# Patient Record
Sex: Female | Born: 1970 | Race: White | Hispanic: No | Marital: Single | Smoking: Current some day smoker
Health system: Southern US, Community
[De-identification: ages and names within clinical notes are randomized; demographics above are authoritative.]

## PROBLEM LIST (undated history)

## (undated) DIAGNOSIS — I10 Essential (primary) hypertension: Secondary | ICD-10-CM

## (undated) DIAGNOSIS — I4891 Unspecified atrial fibrillation: Secondary | ICD-10-CM

## (undated) HISTORY — PX: BREAST ENHANCEMENT SURGERY: SHX7

---

## 2015-05-26 ENCOUNTER — Emergency Department (HOSPITAL_COMMUNITY)
Admission: EM | Admit: 2015-05-26 | Discharge: 2015-05-26 | Disposition: A | Discharge status: Home / Self Care | Payer: Medicaid Other | Attending: Emergency Medicine | Admitting: Emergency Medicine

## 2015-05-26 ENCOUNTER — Emergency Department (HOSPITAL_COMMUNITY): Payer: Medicaid Other

## 2015-05-26 ENCOUNTER — Encounter (HOSPITAL_COMMUNITY): Payer: Self-pay | Admitting: *Deleted

## 2015-05-26 DIAGNOSIS — S79912A Unspecified injury of left hip, initial encounter: Secondary | ICD-10-CM | POA: Diagnosis not present

## 2015-05-26 DIAGNOSIS — S20219A Contusion of unspecified front wall of thorax, initial encounter: Secondary | ICD-10-CM | POA: Diagnosis not present

## 2015-05-26 DIAGNOSIS — Z3202 Encounter for pregnancy test, result negative: Secondary | ICD-10-CM | POA: Diagnosis not present

## 2015-05-26 DIAGNOSIS — S59902A Unspecified injury of left elbow, initial encounter: Secondary | ICD-10-CM | POA: Insufficient documentation

## 2015-05-26 DIAGNOSIS — Y9389 Activity, other specified: Secondary | ICD-10-CM | POA: Insufficient documentation

## 2015-05-26 DIAGNOSIS — S29001A Unspecified injury of muscle and tendon of front wall of thorax, initial encounter: Secondary | ICD-10-CM | POA: Diagnosis present

## 2015-05-26 DIAGNOSIS — F17200 Nicotine dependence, unspecified, uncomplicated: Secondary | ICD-10-CM | POA: Insufficient documentation

## 2015-05-26 DIAGNOSIS — I10 Essential (primary) hypertension: Secondary | ICD-10-CM | POA: Insufficient documentation

## 2015-05-26 DIAGNOSIS — Y92410 Unspecified street and highway as the place of occurrence of the external cause: Secondary | ICD-10-CM | POA: Insufficient documentation

## 2015-05-26 DIAGNOSIS — S301XXA Contusion of abdominal wall, initial encounter: Secondary | ICD-10-CM | POA: Diagnosis not present

## 2015-05-26 DIAGNOSIS — Y998 Other external cause status: Secondary | ICD-10-CM | POA: Insufficient documentation

## 2015-05-26 DIAGNOSIS — S99912A Unspecified injury of left ankle, initial encounter: Secondary | ICD-10-CM | POA: Insufficient documentation

## 2015-05-26 DIAGNOSIS — S8992XA Unspecified injury of left lower leg, initial encounter: Secondary | ICD-10-CM | POA: Insufficient documentation

## 2015-05-26 DIAGNOSIS — S4992XA Unspecified injury of left shoulder and upper arm, initial encounter: Secondary | ICD-10-CM | POA: Insufficient documentation

## 2015-05-26 DIAGNOSIS — Z79899 Other long term (current) drug therapy: Secondary | ICD-10-CM | POA: Diagnosis not present

## 2015-05-26 HISTORY — DX: Essential (primary) hypertension: I10

## 2015-05-26 HISTORY — DX: Unspecified atrial fibrillation: I48.91

## 2015-05-26 LAB — COMPREHENSIVE METABOLIC PANEL
ALK PHOS: 78 U/L (ref 38–126)
ALT: 19 U/L (ref 14–54)
ANION GAP: 12 (ref 5–15)
AST: 54 U/L — ABNORMAL HIGH (ref 15–41)
Albumin: 3.7 g/dL (ref 3.5–5.0)
BILIRUBIN TOTAL: 0.5 mg/dL (ref 0.3–1.2)
BUN: 17 mg/dL (ref 6–20)
CALCIUM: 8.6 mg/dL — AB (ref 8.9–10.3)
CO2: 26 mmol/L (ref 22–32)
CREATININE: 0.79 mg/dL (ref 0.44–1.00)
Chloride: 102 mmol/L (ref 101–111)
Glucose, Bld: 114 mg/dL — ABNORMAL HIGH (ref 65–99)
Potassium: 3.5 mmol/L (ref 3.5–5.1)
SODIUM: 140 mmol/L (ref 135–145)
TOTAL PROTEIN: 5.7 g/dL — AB (ref 6.5–8.1)

## 2015-05-26 LAB — CBC WITH DIFFERENTIAL/PLATELET
Basophils Absolute: 0 10*3/uL (ref 0.0–0.1)
Basophils Relative: 0 %
EOS ABS: 0.1 10*3/uL (ref 0.0–0.7)
Eosinophils Relative: 1 %
HEMATOCRIT: 40.4 % (ref 36.0–46.0)
HEMOGLOBIN: 14.7 g/dL (ref 12.0–15.0)
LYMPHS ABS: 3.4 10*3/uL (ref 0.7–4.0)
LYMPHS PCT: 48 %
MCH: 35.1 pg — AB (ref 26.0–34.0)
MCHC: 36.4 g/dL — AB (ref 30.0–36.0)
MCV: 96.4 fL (ref 78.0–100.0)
MONOS PCT: 6 %
Monocytes Absolute: 0.4 10*3/uL (ref 0.1–1.0)
NEUTROS PCT: 45 %
Neutro Abs: 3.2 10*3/uL (ref 1.7–7.7)
Platelets: 241 10*3/uL (ref 150–400)
RBC: 4.19 MIL/uL (ref 3.87–5.11)
RDW: 12.8 % (ref 11.5–15.5)
WBC: 7.1 10*3/uL (ref 4.0–10.5)

## 2015-05-26 LAB — ABO/RH: ABO/RH(D): O NEG

## 2015-05-26 LAB — PROTIME-INR
INR: 0.94 (ref 0.00–1.49)
Prothrombin Time: 12.8 seconds (ref 11.6–15.2)

## 2015-05-26 LAB — APTT: APTT: 28 s (ref 24–37)

## 2015-05-26 LAB — ETHANOL: Alcohol, Ethyl (B): 183 mg/dL — ABNORMAL HIGH (ref <5)

## 2015-05-26 LAB — I-STAT BETA HCG BLOOD, ED (MC, WL, AP ONLY): I-stat hCG, quantitative: <5 m[IU]/mL (ref <5)

## 2015-05-26 MED ORDER — IBUPROFEN 800 MG PO TABS: 800.0000 mg | ORAL_TABLET | Freq: Three times a day (TID) | ORAL | Status: DC

## 2015-05-26 MED ORDER — OXYCODONE-ACETAMINOPHEN 5-325 MG PO TABS: 1.0000 | ORAL_TABLET | ORAL | Status: DC | PRN

## 2015-05-26 MED ORDER — ONDANSETRON HCL 4 MG/2ML IJ SOLN
4.0000 mg | Freq: Once | INTRAMUSCULAR | Status: AC
  Administered 2015-05-26: 4 mg via INTRAVENOUS
  Filled 2015-05-26: qty 2

## 2015-05-26 MED ORDER — FENTANYL CITRATE (PF) 100 MCG/2ML IJ SOLN
50.0000 ug | Freq: Once | INTRAMUSCULAR | Status: AC
  Administered 2015-05-26: 50 ug via INTRAVENOUS
  Filled 2015-05-26: qty 2

## 2015-05-26 MED ORDER — CYCLOBENZAPRINE HCL 10 MG PO TABS: 10.0000 mg | ORAL_TABLET | Freq: Three times a day (TID) | ORAL | Status: DC | PRN

## 2015-05-26 MED ORDER — IOPAMIDOL (ISOVUE-300) INJECTION 61%
100.0000 mL | Freq: Once | INTRAVENOUS | Status: AC | PRN
  Administered 2015-05-26: 100 mL via INTRAVENOUS
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
## 2015-05-27 LAB — TYPE AND SCREEN
ABO/RH(D): O NEG
Antibody Screen: NEGATIVE

## 2015-09-11 ENCOUNTER — Emergency Department (HOSPITAL_COMMUNITY): Payer: Medicaid Other

## 2015-09-11 ENCOUNTER — Emergency Department (HOSPITAL_COMMUNITY)
Admission: EM | Admit: 2015-09-11 | Discharge: 2015-09-11 | Disposition: A | Discharge status: Home / Self Care | Payer: Medicaid Other | Attending: Emergency Medicine | Admitting: Emergency Medicine

## 2015-09-11 ENCOUNTER — Encounter (HOSPITAL_COMMUNITY): Payer: Self-pay

## 2015-09-11 DIAGNOSIS — S29012A Strain of muscle and tendon of back wall of thorax, initial encounter: Secondary | ICD-10-CM | POA: Insufficient documentation

## 2015-09-11 DIAGNOSIS — Z791 Long term (current) use of non-steroidal anti-inflammatories (NSAID): Secondary | ICD-10-CM | POA: Diagnosis not present

## 2015-09-11 DIAGNOSIS — Y939 Activity, unspecified: Secondary | ICD-10-CM | POA: Insufficient documentation

## 2015-09-11 DIAGNOSIS — M62830 Muscle spasm of back: Secondary | ICD-10-CM | POA: Diagnosis not present

## 2015-09-11 DIAGNOSIS — I10 Essential (primary) hypertension: Secondary | ICD-10-CM | POA: Diagnosis not present

## 2015-09-11 DIAGNOSIS — Y929 Unspecified place or not applicable: Secondary | ICD-10-CM | POA: Insufficient documentation

## 2015-09-11 DIAGNOSIS — X58XXXA Exposure to other specified factors, initial encounter: Secondary | ICD-10-CM | POA: Insufficient documentation

## 2015-09-11 DIAGNOSIS — F17200 Nicotine dependence, unspecified, uncomplicated: Secondary | ICD-10-CM | POA: Insufficient documentation

## 2015-09-11 DIAGNOSIS — M546 Pain in thoracic spine: Secondary | ICD-10-CM | POA: Diagnosis present

## 2015-09-11 DIAGNOSIS — Y999 Unspecified external cause status: Secondary | ICD-10-CM | POA: Insufficient documentation

## 2015-09-11 DIAGNOSIS — Z79899 Other long term (current) drug therapy: Secondary | ICD-10-CM | POA: Insufficient documentation

## 2015-09-11 MED ORDER — CYCLOBENZAPRINE HCL 10 MG PO TABS: 5.0000 mg | ORAL_TABLET | Freq: Two times a day (BID) | ORAL | 0 refills | Status: AC | PRN

## 2015-09-11 MED ORDER — KETOROLAC TROMETHAMINE 60 MG/2ML IM SOLN
60.0000 mg | Freq: Once | INTRAMUSCULAR | Status: AC
  Administered 2015-09-11: 60 mg via INTRAMUSCULAR
  Filled 2015-09-11: qty 2

## 2015-09-11 NOTE — ED Notes (Signed)
MD at bedside. 
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
## 2015-09-11 NOTE — ED Provider Notes (Signed)
[facility]-EMERGENCY DEPT Provider Note   CSN: [id number] Arrival date & time: 09/11/15  1325  First Provider Contact:  First MD Initiated Contact with Patient 09/11/15 1527        History   Chief Complaint Chief Complaint  Patient presents with  . Back Pain    HPI Crystal Best is a 45 y.o. female.  The history is provided by the patient. No language interpreter was used.  Back Pain      Crystal Best is a 45 y.o. female who presents to the Emergency Department complaining of right side pain.  She reports 2 days to pain to her right upper back just below her scapula. Pain is a sharp and stabbing sensation. It radiates around her right axillary region. She denies any fevers, cough, shortness of breath, abdominal pain, nausea, vomiting, numbness, weakness. No reports of recent injuries. Her pain is better when she lies on her left side. Her pain is worse with movement and deep breaths. No oral hormone use, no history of DVT/PE, no recent injuries. Symptoms are severe, constant and worsening.  Past Medical History:  Diagnosis Date  . Atrial fibrillation (HCC)   . Hypertension     There are no active problems to display for this patient.   Past Surgical History:  Procedure Laterality Date  . BREAST ENHANCEMENT SURGERY      OB History    No data available       Home Medications    Prior to Admission medications   Medication Sig Start Date End Date Taking? Authorizing Provider  carbamazepine (TEGRETOL) 100 MG chewable tablet Chew 50 mg by mouth at bedtime.  04/20/15  Yes Historical Provider, MD  clonazePAM (KLONOPIN) 1 MG tablet Take 1 mg by mouth 2 (two) times daily.    Yes Historical Provider, MD  ibuprofen (ADVIL,MOTRIN) 200 MG tablet Take 600-800 mg by mouth every 6 (six) hours as needed for headache or moderate pain.   Yes Historical Provider, MD  Prenat Vit-Fe Gly Cys-FA-Omega (ENBRACE HR) CAPS Take 1 capsule by mouth daily. 05/05/15  Yes Historical Provider, MD    triamterene-hydrochlorothiazide (MAXZIDE-25) 37.5-25 MG tablet Take 0.5 tablets by mouth daily.  05/19/15  Yes Historical Provider, MD  cyclobenzaprine (FLEXERIL) 10 MG tablet Take 0.5-1 tablets (5-10 mg total) by mouth 2 (two) times daily as needed for muscle spasms. 09/11/15   Tilden Fossa, MD    Family History History reviewed. No pertinent family history.  Social History Social History  Substance Use Topics  . Smoking status: Current Some Day Smoker  . Smokeless tobacco: Not on file  . Alcohol use Yes     Allergies   Asa [aspirin]; Codeine; Erythromycin; Sulfa antibiotics; and Latex   Review of Systems Review of Systems  Musculoskeletal: Positive for back pain.  All other systems reviewed and are negative.    Physical Exam Updated Vital Signs BP (!) 131/101   Pulse 91   Temp 98.2 F (36.8 C) (Oral)   Resp 16   Ht  (1.6 m)   Wt 142 lb (64.4 kg)   LMP 09/04/2015   SpO2 99%   BMI 25.15 kg/m   Physical Exam  Constitutional: She is oriented to person, place, and time. She appears well-developed and well-nourished.  Uncomfortable appearing  HENT:  Head: Normocephalic and atraumatic.  Neck: Neck supple.  Cardiovascular: Normal rate and regular rhythm.   No murmur heard. Pulmonary/Chest: Effort normal and breath sounds normal. No respiratory distress.  Abdominal: Soft. There  is no tenderness. There is no rebound and no guarding.  No CVA tenderness  Musculoskeletal: She exhibits no edema.  Small bruise to the right lower back. There is tenderness just beneath the right scapula around to the right mid axilla. No overlying rashes. No midline thoracic or lumbar tenderness. 2+ radial pulses bilaterally.  Neurological: She is alert and oriented to person, place, and time.  Moves all extremities symmetrically. 5 out of 5 grip strength bilaterally  Skin: Skin is warm and dry. Capillary refill takes less than 2 seconds.  Psychiatric: She has a normal mood and affect.  Her behavior is normal.  Nursing note and vitals reviewed.    ED Treatments / Results  Labs (all labs ordered are listed, but only abnormal results are displayed) Labs Reviewed - No data to display  EKG  EKG Interpretation None       Radiology Dg Chest 2 View  Result Date: 09/11/2015 CLINICAL DATA:  Right-sided chest pain for 2 days, initial encounter EXAM: CHEST  2 VIEW COMPARISON:  05/26/2015 FINDINGS: The heart size and mediastinal contours are within normal limits. Both lungs are clear. The visualized skeletal structures are unremarkable. IMPRESSION: No active cardiopulmonary disease. Electronically Signed   By: Alcide Clever M.D.   On: 09/11/2015 16:29   Procedures Procedures (including critical care time)  Medications Ordered in ED Medications  ketorolac (TORADOL) injection 60 mg (60 mg Intramuscular Given 09/11/15 1544)     Initial Impression / Assessment and Plan / ED Course  I have reviewed the triage vital signs and the nursing notes.  Pertinent labs & imaging results that were available during my care of the patient were reviewed by me and considered in my medical decision making (see chart for details).  Clinical Course    Patient here for evaluation of right-sided thoracic back pain. She is nontoxic appearing on examination with focal tenderness. Presentation is not consistent with PE, dissection, pneumonia, pneumothorax. Discussed with patient and care for musculoskeletal back pain. Discussed outpatient follow-up and return precautions.  Final Clinical Impressions(s) / ED Diagnoses   Final diagnoses:  Upper back strain, initial encounter  Muscle spasm of back    New Prescriptions Discharge Medication List as of 09/11/2015  5:02 PM    START taking these medications   Details  cyclobenzaprine (FLEXERIL) 10 MG tablet Take 0.5-1 tablets (5-10 mg total) by mouth 2 (two) times daily as needed for muscle spasms., Starting Sun 09/11/2015, Print           Tilden Fossa, MD 09/11/15 [phone]
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
